# Patient Record
Sex: Male | Born: 1985 | Race: Black or African American | Hispanic: No | Marital: Married | Smoking: Never smoker
Health system: Southern US, Community
[De-identification: ages and names within clinical notes are randomized; demographics above are authoritative.]

## PROBLEM LIST (undated history)

## (undated) DIAGNOSIS — J020 Streptococcal pharyngitis: Secondary | ICD-10-CM

---
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
## 2006-01-08 ENCOUNTER — Emergency Department (HOSPITAL_COMMUNITY): Admission: EM | Admit: 2006-01-08 | Discharge: 2006-01-08 | Payer: Self-pay | Admitting: Family Medicine

## 2007-03-01 ENCOUNTER — Emergency Department (HOSPITAL_COMMUNITY): Admission: EM | Admit: 2007-03-01 | Discharge: 2007-03-01 | Payer: Self-pay | Admitting: Family Medicine

## 2007-03-07 ENCOUNTER — Emergency Department (HOSPITAL_COMMUNITY): Admission: EM | Admit: 2007-03-07 | Discharge: 2007-03-07 | Payer: Self-pay | Admitting: Family Medicine

## 2007-05-02 ENCOUNTER — Emergency Department (HOSPITAL_COMMUNITY): Admission: EM | Admit: 2007-05-02 | Discharge: 2007-05-02 | Payer: Self-pay | Admitting: Emergency Medicine

## 2008-03-19 ENCOUNTER — Emergency Department (HOSPITAL_COMMUNITY): Admission: EM | Admit: 2008-03-19 | Discharge: 2008-03-19 | Payer: Self-pay | Admitting: Family Medicine

## 2008-06-23 ENCOUNTER — Emergency Department (HOSPITAL_COMMUNITY): Admission: EM | Admit: 2008-06-23 | Discharge: 2008-06-23 | Payer: Self-pay | Admitting: Emergency Medicine

## 2010-03-10 ENCOUNTER — Other Ambulatory Visit: Payer: Self-pay | Admitting: Internal Medicine

## 2010-03-10 ENCOUNTER — Ambulatory Visit
Admission: RE | Admit: 2010-03-10 | Discharge: 2010-03-10 | Disposition: A | Discharge status: Home / Self Care | Payer: Self-pay | Source: Ambulatory Visit | Attending: Internal Medicine | Admitting: Internal Medicine

## 2010-03-10 DIAGNOSIS — M545 Low back pain: Secondary | ICD-10-CM

## 2011-07-26 DIAGNOSIS — B9789 Other viral agents as the cause of diseases classified elsewhere: Secondary | ICD-10-CM | POA: Insufficient documentation

## 2011-07-27 ENCOUNTER — Other Ambulatory Visit (HOSPITAL_BASED_OUTPATIENT_CLINIC_OR_DEPARTMENT_OTHER): Payer: Self-pay | Admitting: Emergency Medicine

## 2011-07-27 ENCOUNTER — Ambulatory Visit (HOSPITAL_BASED_OUTPATIENT_CLINIC_OR_DEPARTMENT_OTHER)
Admission: RE | Admit: 2011-07-27 | Discharge: 2011-07-27 | Disposition: A | Discharge status: Home / Self Care | Payer: BC Managed Care – PPO | Source: Ambulatory Visit | Attending: Emergency Medicine | Admitting: Emergency Medicine

## 2011-07-27 ENCOUNTER — Emergency Department (HOSPITAL_BASED_OUTPATIENT_CLINIC_OR_DEPARTMENT_OTHER)
Admission: EM | Admit: 2011-07-27 | Discharge: 2011-07-27 | Disposition: A | Discharge status: Home / Self Care | Payer: BC Managed Care – PPO | Attending: Emergency Medicine | Admitting: Emergency Medicine

## 2011-07-27 DIAGNOSIS — R509 Fever, unspecified: Secondary | ICD-10-CM

## 2011-07-27 LAB — DIFFERENTIAL
Band Neutrophils: 0 % (ref 0–10)
Basophils Absolute: 0 10*3/uL (ref 0.0–0.1)
Basophils Relative: 0 % (ref 0–1)
Blasts: 0 %
Eosinophils Absolute: 0.3 10*3/uL (ref 0.0–0.7)
Eosinophils Relative: 2 % (ref 0–5)
Lymphocytes Relative: 15 % (ref 12–46)
Lymphs Abs: 2.1 10*3/uL (ref 0.7–4.0)
Metamyelocytes Relative: 0 %
Monocytes Absolute: 2 10*3/uL — ABNORMAL HIGH (ref 0.1–1.0)
Monocytes Relative: 14 % — ABNORMAL HIGH (ref 3–12)
Myelocytes: 0 %
Neutro Abs: 9.6 10*3/uL — ABNORMAL HIGH (ref 1.7–7.7)
Promyelocytes Absolute: 0 %
nRBC: 0 /100 WBC

## 2011-07-27 LAB — URINE MICROSCOPIC-ADD ON

## 2011-07-27 LAB — URINALYSIS, ROUTINE W REFLEX MICROSCOPIC
Hgb urine dipstick: NEGATIVE
Nitrite: NEGATIVE
Specific Gravity, Urine: 1.027 (ref 1.005–1.030)
Urobilinogen, UA: >8 mg/dL — ABNORMAL HIGH (ref 0.0–1.0)
pH: 6 (ref 5.0–8.0)

## 2011-07-27 LAB — BASIC METABOLIC PANEL
BUN: 11 mg/dL (ref 6–23)
CO2: 25 mEq/L (ref 19–32)
Calcium: 9 mg/dL (ref 8.4–10.5)
Chloride: 99 mEq/L (ref 96–112)
Creatinine, Ser: 1 mg/dL (ref 0.50–1.35)
GFR calc Af Amer: >90 mL/min (ref >90)
GFR calc non Af Amer: >90 mL/min (ref >90)
Glucose, Bld: 94 mg/dL (ref 70–99)
Potassium: 3.4 mEq/L — ABNORMAL LOW (ref 3.5–5.1)
Sodium: 137 mEq/L (ref 135–145)

## 2011-07-27 LAB — CBC
HCT: 31.5 % — ABNORMAL LOW (ref 39.0–52.0)
Hemoglobin: 10.5 g/dL — ABNORMAL LOW (ref 13.0–17.0)
MCH: 28.1 pg (ref 26.0–34.0)
MCHC: 33.3 g/dL (ref 30.0–36.0)
MCV: 84.2 fL (ref 78.0–100.0)
Platelets: 234 10*3/uL (ref 150–400)
WBC: 14 10*3/uL — ABNORMAL HIGH (ref 4.0–10.5)

## 2011-07-29 ENCOUNTER — Emergency Department (HOSPITAL_BASED_OUTPATIENT_CLINIC_OR_DEPARTMENT_OTHER): Payer: BC Managed Care – PPO

## 2011-07-29 ENCOUNTER — Encounter (HOSPITAL_BASED_OUTPATIENT_CLINIC_OR_DEPARTMENT_OTHER): Payer: Self-pay

## 2011-07-29 ENCOUNTER — Emergency Department (HOSPITAL_BASED_OUTPATIENT_CLINIC_OR_DEPARTMENT_OTHER)
Admission: EM | Admit: 2011-07-29 | Discharge: 2011-07-29 | Disposition: A | Discharge status: Home / Self Care | Payer: BC Managed Care – PPO | Attending: Emergency Medicine | Admitting: Emergency Medicine

## 2011-07-29 DIAGNOSIS — N390 Urinary tract infection, site not specified: Secondary | ICD-10-CM

## 2011-07-29 DIAGNOSIS — R509 Fever, unspecified: Secondary | ICD-10-CM

## 2011-07-29 DIAGNOSIS — R51 Headache: Secondary | ICD-10-CM | POA: Insufficient documentation

## 2011-07-29 DIAGNOSIS — M25519 Pain in unspecified shoulder: Secondary | ICD-10-CM | POA: Insufficient documentation

## 2011-07-29 DIAGNOSIS — M542 Cervicalgia: Secondary | ICD-10-CM | POA: Insufficient documentation

## 2011-07-29 LAB — COMPREHENSIVE METABOLIC PANEL
ALT: 20 U/L (ref 0–53)
AST: 21 U/L (ref 0–37)
Albumin: 3.4 g/dL — ABNORMAL LOW (ref 3.5–5.2)
Alkaline Phosphatase: 71 U/L (ref 39–117)
BUN: 11 mg/dL (ref 6–23)
CO2: 24 mEq/L (ref 19–32)
Calcium: 9 mg/dL (ref 8.4–10.5)
Chloride: 100 mEq/L (ref 96–112)
Creatinine, Ser: 0.9 mg/dL (ref 0.50–1.35)
GFR calc Af Amer: >90 mL/min (ref >90)
GFR calc non Af Amer: >90 mL/min (ref >90)
Glucose, Bld: 162 mg/dL — ABNORMAL HIGH (ref 70–99)
Potassium: 2.9 mEq/L — ABNORMAL LOW (ref 3.5–5.1)
Sodium: 137 mEq/L (ref 135–145)
Total Bilirubin: 0.9 mg/dL (ref 0.3–1.2)

## 2011-07-29 LAB — URINALYSIS, ROUTINE W REFLEX MICROSCOPIC
Bilirubin Urine: NEGATIVE
Glucose, UA: NEGATIVE mg/dL
Hgb urine dipstick: NEGATIVE
Ketones, ur: 15 mg/dL — AB
Leukocytes, UA: NEGATIVE
Protein, ur: 30 mg/dL — AB
Specific Gravity, Urine: 1.026 (ref 1.005–1.030)
Urobilinogen, UA: >8 mg/dL — ABNORMAL HIGH (ref 0.0–1.0)

## 2011-07-29 LAB — URINE MICROSCOPIC-ADD ON

## 2011-07-29 LAB — CBC
HCT: 29.2 % — ABNORMAL LOW (ref 39.0–52.0)
MCH: 28.2 pg (ref 26.0–34.0)
MCHC: 33.9 g/dL (ref 30.0–36.0)
MCV: 83.2 fL (ref 78.0–100.0)
RDW: 13.2 % (ref 11.5–15.5)
WBC: 15.4 10*3/uL — ABNORMAL HIGH (ref 4.0–10.5)

## 2011-07-29 MED ORDER — SODIUM CHLORIDE 0.9 % IV SOLN
1000.0000 mL | INTRAVENOUS | Status: DC
  Administered 2011-07-29: 1000 mL via INTRAVENOUS

## 2011-07-29 MED ORDER — POTASSIUM CHLORIDE CRYS ER 20 MEQ PO TBCR
40.0000 meq | EXTENDED_RELEASE_TABLET | Freq: Once | ORAL | Status: AC
  Administered 2011-07-29: 40 meq via ORAL
  Filled 2011-07-29: qty 2

## 2011-07-29 MED ORDER — ONDANSETRON HCL 4 MG/2ML IJ SOLN
4.0000 mg | Freq: Once | INTRAMUSCULAR | Status: AC
  Administered 2011-07-29: 4 mg via INTRAVENOUS

## 2011-07-29 MED ORDER — DEXTROSE 5 % IV SOLN
1.0000 g | Freq: Once | INTRAVENOUS | Status: AC
  Administered 2011-07-29: 1 g via INTRAVENOUS
  Filled 2011-07-29: qty 10

## 2011-07-29 MED ORDER — CEPHALEXIN 500 MG PO CAPS: 500.0000 mg | ORAL_CAPSULE | Freq: Four times a day (QID) | ORAL | Status: AC

## 2011-07-29 MED ORDER — SODIUM CHLORIDE 0.9 % IV SOLN
1000.0000 mL | Freq: Once | INTRAVENOUS | Status: AC
  Administered 2011-07-29: 1000 mL via INTRAVENOUS

## 2011-07-29 MED ORDER — ONDANSETRON HCL 4 MG/2ML IJ SOLN
INTRAMUSCULAR | Status: AC
  Filled 2011-07-29: qty 2

## 2011-07-29 NOTE — ED Notes (Signed)
Pt states he is still unable to void  

## 2011-07-29 NOTE — ED Provider Notes (Signed)
History     CSN: [id number]  Arrival date & time 07/29/11  3244   First MD Initiated Contact with Patient 07/29/11 0820      Chief Complaint  Patient presents with  . Fever  . Neck Pain  . Shoulder Pain  . Headache    (Consider location/radiation/quality/duration/timing/severity/associated sxs/prior treatment) HPI  Patient complaining of body aches and fever for 4 days. Patient seen here on Sunday and had blood work and chest x-Tavion Senkbeil. Chest x-Johnpatrick Jenny did not show any infiltrates. Patient states he has had some productive cough. He has had some headache although not severe. He denies any abdominal pain nausea or vomiting. He denies any urinary tract infection symptoms. He denies any other significant exposures to anyone that was sick. He has been generally weak but denies being lightheaded or having syncope.  History reviewed. No pertinent past medical history.  History reviewed. No pertinent past surgical history.  No family history on file.  History  Substance Use Topics  . Smoking status: Never Smoker   . Smokeless tobacco: Never Used  . Alcohol Use: Yes     occasional      Review of Systems  All other systems reviewed and are negative.    Allergies  Tramadol  Home Medications   Current Outpatient Rx  Name Route Sig Dispense Refill  . ACETAMINOPHEN 325 MG PO TABS Oral Take 650 mg by mouth every 6 (six) hours as needed.      BP 111/61  Pulse 101  Temp 99 F (37.2 C) (Oral)  Resp 18  Ht 5\' 8"  (1.727 m)  Wt 215 lb (97.523 kg)  BMI 32.69 kg/m2  SpO2 98%  Physical Exam  Nursing note and vitals reviewed. Constitutional: He is oriented to person, place, and time. He appears well-developed and well-nourished.  HENT:  Head: Normocephalic and atraumatic.  Right Ear: External ear normal.  Left Ear: External ear normal.  Nose: Nose normal.  Mouth/Throat: Oropharynx is clear and moist.  Eyes: Conjunctivae and EOM are normal. Pupils are equal, round, and reactive  to light.  Neck: Normal range of motion. Neck supple.  Cardiovascular: Normal rate, regular rhythm, normal heart sounds and intact distal pulses.   Pulmonary/Chest: Effort normal and breath sounds normal.  Abdominal: Soft. Bowel sounds are normal.  Musculoskeletal: Normal range of motion.  Neurological: He is alert and oriented to person, place, and time. He has normal reflexes.  Skin: Skin is warm and dry.  Psychiatric: He has a normal mood and affect. His behavior is normal. Thought content normal.    ED Course  Procedures (including critical care time)  Labs Reviewed  COMPREHENSIVE METABOLIC PANEL - Abnormal; Notable for the following:    Potassium 2.9 (*)     Glucose, Bld 162 (*)     Albumin 3.4 (*)     All other components within normal limits  CBC - Abnormal; Notable for the following:    WBC 15.4 (*)     RBC 3.51 (*)     Hemoglobin 9.9 (*)     HCT 29.2 (*)     All other components within normal limits  URINALYSIS, ROUTINE W REFLEX MICROSCOPIC - Abnormal; Notable for the following:    Color, Urine ORANGE (*)  BIOCHEMICALS MAY BE AFFECTED BY COLOR   Ketones, ur 15 (*)     Protein, ur 30 (*)     Urobilinogen, UA >8.0 (*)     All other components within normal limits  URINE MICROSCOPIC-ADD ON -  Abnormal; Notable for the following:    Bacteria, UA FEW (*)     All other components within normal limits   Dg Chest 2 View  07/29/2011  *RADIOLOGY REPORT*  Clinical Data:  Fever, headache, neck pain, shoulder pain, cough  CHEST - 2 VIEW  Comparison: 07/26/2011  Findings: Normal heart size, mediastinal contours, and pulmonary vascularity. Lungs clear. No pleural effusion or pneumothorax. Bones unremarkable.  IMPRESSION: Normal exam.  Original Report Authenticated By: Lollie Marrow, M.D.     No diagnosis found.    MDM   Patient with continued elevated white count of 15,400 and continued anemia with hemoglobin of 9.9. This is not showing any significant change from previous. He  has 15 ketones in his urine and is given 2 L of normal saline here. He is able to take by mouth is not having any vomiting or diarrhea. Blood sugars elevated at 162. He has few bacteria in his urine. Urine is to be cultured and he is given Rocephin here and placed on Keflex.      Hilario Quarry, MD 07/29/11 1235

## 2011-07-29 NOTE — ED Notes (Signed)
Pt reports headache, neck pain, shoulder pain and fever of 103 last pm.  Seen in ED on Sunday but states he's not feeling any better.

## 2011-10-07 ENCOUNTER — Other Ambulatory Visit: Payer: Self-pay | Admitting: Podiatry

## 2011-10-07 DIAGNOSIS — R52 Pain, unspecified: Secondary | ICD-10-CM

## 2011-10-10 ENCOUNTER — Ambulatory Visit (HOSPITAL_BASED_OUTPATIENT_CLINIC_OR_DEPARTMENT_OTHER)
Admission: RE | Admit: 2011-10-10 | Discharge: 2011-10-10 | Disposition: A | Discharge status: Home / Self Care | Payer: BC Managed Care – PPO | Source: Ambulatory Visit | Attending: Podiatry | Admitting: Podiatry

## 2011-10-10 DIAGNOSIS — M25579 Pain in unspecified ankle and joints of unspecified foot: Secondary | ICD-10-CM | POA: Insufficient documentation

## 2011-10-10 DIAGNOSIS — M7989 Other specified soft tissue disorders: Secondary | ICD-10-CM | POA: Insufficient documentation

## 2011-10-10 DIAGNOSIS — R52 Pain, unspecified: Secondary | ICD-10-CM

## 2011-10-10 MED ORDER — GADOBENATE DIMEGLUMINE 529 MG/ML IV SOLN
20.0000 mL | Freq: Once | INTRAVENOUS | Status: AC | PRN
  Administered 2011-10-10: 20 mL via INTRAVENOUS

## 2012-03-15 ENCOUNTER — Encounter: Payer: Self-pay | Admitting: Podiatry

## 2012-07-05 ENCOUNTER — Encounter (HOSPITAL_BASED_OUTPATIENT_CLINIC_OR_DEPARTMENT_OTHER): Payer: Self-pay | Admitting: *Deleted

## 2012-07-05 ENCOUNTER — Emergency Department (HOSPITAL_BASED_OUTPATIENT_CLINIC_OR_DEPARTMENT_OTHER)
Admission: EM | Admit: 2012-07-05 | Discharge: 2012-07-05 | Disposition: A | Discharge status: Home / Self Care | Payer: BC Managed Care – PPO | Attending: Emergency Medicine | Admitting: Emergency Medicine

## 2012-07-05 DIAGNOSIS — M545 Low back pain, unspecified: Secondary | ICD-10-CM

## 2012-07-05 MED ORDER — NAPROXEN 500 MG PO TABS: 500.0000 mg | ORAL_TABLET | Freq: Two times a day (BID) | ORAL | Status: DC

## 2012-07-05 MED ORDER — OXYCODONE-ACETAMINOPHEN 5-325 MG PO TABS
1.0000 | ORAL_TABLET | Freq: Once | ORAL | Status: AC
  Administered 2012-07-05: 1 via ORAL
  Filled 2012-07-05 (×2): qty 1

## 2012-07-05 MED ORDER — IBUPROFEN 800 MG PO TABS
800.0000 mg | ORAL_TABLET | Freq: Once | ORAL | Status: AC
  Administered 2012-07-05: 800 mg via ORAL
  Filled 2012-07-05: qty 1

## 2012-07-05 MED ORDER — OXYCODONE-ACETAMINOPHEN 5-325 MG PO TABS: 1.0000 | ORAL_TABLET | ORAL | Status: DC | PRN

## 2012-07-05 MED ORDER — CYCLOBENZAPRINE HCL 10 MG PO TABS
5.0000 mg | ORAL_TABLET | Freq: Once | ORAL | Status: AC
  Administered 2012-07-05: 5 mg via ORAL
  Filled 2012-07-05: qty 1

## 2012-07-05 MED ORDER — ORPHENADRINE CITRATE ER 100 MG PO TB12: 100.0000 mg | ORAL_TABLET | Freq: Two times a day (BID) | ORAL | Status: DC

## 2012-07-05 NOTE — ED Provider Notes (Signed)
History  This chart was scribed for Dione Booze, MD by Ardelia Mems, ED Scribe. This patient was seen in room MH12/MH12 and the patient's care was started at 12:20 AM.  CSN: [id number]  Arrival date & time 07/05/12  0004   Chief Complaint  Patient presents with  . Back Pain    The history is provided by the patient. No language interpreter was used.   HPI Comments: Calvin Kirby is a 26 y.o. male who presents to the Emergency Department complaining of constant, moderate, "7/10", non-radiating lower middle back pain of 4 months duration. In describing his pain, pt states that "it feels like my muscles are being squeezed". Pt states that his pain made worse with standing and with moving his legs in certain ways. Pt states that nothing makes his pain better, including Ibuprofen, which he has taken without relief. Pt denies back pain prior to 4 months ago. Pt states that he did not drive himself here. Pt states that he is otherwise in good health. Pt denies leg pain, neck pain, abdominal pain, weakness, numbness, saddle anaesthesia, bowel or bladder incontinence or any other symptoms.  PCP- Dr. Greggory Stallion Osei-Bonsu   History reviewed. No pertinent past medical history.  History reviewed. No pertinent past surgical history.  History reviewed. No pertinent family history.  History  Substance Use Topics  . Smoking status: Never Smoker   . Smokeless tobacco: Never Used  . Alcohol Use: Yes     Comment: occasional    Review of Systems  Constitutional: Negative for fever and chills.  HENT: Negative for congestion, sore throat, rhinorrhea and neck pain.   Eyes: Negative for visual disturbance.  Respiratory: Negative for cough and shortness of breath.   Cardiovascular: Negative for chest pain.  Gastrointestinal: Negative for nausea, vomiting, abdominal pain and diarrhea.       Denies bowel incontinence.  Genitourinary: Negative for dysuria.       Denies bladder incontinence.   Musculoskeletal: Positive for back pain (lower, middle).  Skin: Negative for rash.  Neurological: Negative for weakness and numbness.  Psychiatric/Behavioral: Negative for confusion.  A complete 10 system review of systems was obtained and all systems are negative except as noted in the HPI and PMH.   Allergies  Tramadol  Home Medications   Current Outpatient Rx  Name  Route  Sig  Dispense  Refill  . acetaminophen (TYLENOL) 325 MG tablet   Oral   Take 650 mg by mouth every 6 (six) hours as needed.          Triage Vitals: P 127/65  Pulse 73  Temp(Src) 97.6 F (36.4 C) (Oral)  Resp 16  Ht 5\' 8"  (1.727 m)  Wt 205 lb (92.987 kg)  BMI 31.18 kg/m2  SpO2 100%  Physical Exam  Nursing note and vitals reviewed. Constitutional: He is oriented to person, place, and time. He appears well-developed and well-nourished.  Lying in bed in right lateral decubitus position, with hips and knees flexed.   HENT:  Head: Normocephalic and atraumatic.  Eyes: EOM are normal. Pupils are equal, round, and reactive to light.  Neck: Normal range of motion. Neck supple. No tracheal deviation present.  Cardiovascular: Normal rate and regular rhythm.   Pulmonary/Chest: Effort normal. No respiratory distress.  Abdominal: Soft. There is no tenderness.  Musculoskeletal:  Mild tenderness of the L-spine. Bilateral paralumbar spasms, worse on the right. Negative straight leg raise.  Neurological: He is alert and oriented to person, place, and time.  Skin:  Skin is warm and dry. No rash noted.  Psychiatric: He has a normal mood and affect. His behavior is normal.    ED Course  Procedures (including critical care time)  DIAGNOSTIC STUDIES: Oxygen Saturation is 100% on RA, normal by my interpretation.    COORDINATION OF CARE: 12:25 AM- Pt advised of option to treat back pain with anti-inflammatory drugs, muscle relaxants and narcotics and pt agrees. Pt advised to follow-up with PCP, and to keep ice on  his back as needed and pt agrees.  Medications  ibuprofen (ADVIL,MOTRIN) tablet 800 mg (800 mg Oral Given 07/05/12 0034)  cyclobenzaprine (FLEXERIL) tablet 5 mg (5 mg Oral Given 07/05/12 0034)  oxyCODONE-acetaminophen (PERCOCET/ROXICET) 5-325 MG per tablet 1 tablet (1 tablet Oral Given 07/05/12 0034)     1. Low back pain     MDM  Low back pain without evidence of neurologic injury. No indication for imaging of lumbar spine at this time. He is discharged with prescriptions for naproxen, orphenadrine, and oxycodone-acetaminophen. He is to followup with his PCP. Given his long duration of pain, if he fails to respond to conservative treatments, he should be considered for MRI scanning and consideration for neurosurgical referral.    I personally performed the services described in this documentation, which was scribed in my presence. The recorded information has been reviewed and is accurate.     Dione Booze, MD 07/05/12 [phone]

## 2012-07-05 NOTE — ED Notes (Signed)
Pt c/o lower back pain x 2 hrs

## 2012-08-08 ENCOUNTER — Ambulatory Visit: Payer: BC Managed Care – PPO | Attending: Internal Medicine | Admitting: Physical Therapy

## 2012-08-08 DIAGNOSIS — M545 Low back pain, unspecified: Secondary | ICD-10-CM | POA: Insufficient documentation

## 2012-08-08 DIAGNOSIS — IMO0001 Reserved for inherently not codable concepts without codable children: Secondary | ICD-10-CM | POA: Insufficient documentation

## 2012-08-08 DIAGNOSIS — M6281 Muscle weakness (generalized): Secondary | ICD-10-CM | POA: Insufficient documentation

## 2012-08-26 ENCOUNTER — Ambulatory Visit: Payer: BC Managed Care – PPO | Admitting: Physical Therapy

## 2012-10-26 ENCOUNTER — Emergency Department (HOSPITAL_BASED_OUTPATIENT_CLINIC_OR_DEPARTMENT_OTHER): Payer: BC Managed Care – PPO

## 2012-10-26 ENCOUNTER — Encounter (HOSPITAL_BASED_OUTPATIENT_CLINIC_OR_DEPARTMENT_OTHER): Payer: Self-pay | Admitting: Emergency Medicine

## 2012-10-26 ENCOUNTER — Emergency Department (HOSPITAL_BASED_OUTPATIENT_CLINIC_OR_DEPARTMENT_OTHER)
Admission: EM | Admit: 2012-10-26 | Discharge: 2012-10-26 | Disposition: A | Discharge status: Home / Self Care | Payer: BC Managed Care – PPO | Attending: Emergency Medicine | Admitting: Emergency Medicine

## 2012-10-26 DIAGNOSIS — Z79899 Other long term (current) drug therapy: Secondary | ICD-10-CM | POA: Insufficient documentation

## 2012-10-26 DIAGNOSIS — R091 Pleurisy: Secondary | ICD-10-CM | POA: Insufficient documentation

## 2012-10-26 DIAGNOSIS — R071 Chest pain on breathing: Secondary | ICD-10-CM | POA: Insufficient documentation

## 2012-10-26 DIAGNOSIS — R079 Chest pain, unspecified: Secondary | ICD-10-CM

## 2012-10-26 MED ORDER — NAPROXEN 500 MG PO TABS: 500.0000 mg | ORAL_TABLET | Freq: Two times a day (BID) | ORAL | Status: DC

## 2012-10-26 MED ORDER — HYDROCODONE-ACETAMINOPHEN 5-325 MG PO TABS: 1.0000 | ORAL_TABLET | ORAL | Status: DC | PRN

## 2012-10-26 NOTE — ED Notes (Signed)
C/o right side CP/shoulder pain x 1 week-states pain is worse with movement and when he sneezes-denies injury

## 2012-10-26 NOTE — ED Provider Notes (Signed)
CSN: [id number]     Arrival date & time 10/26/12  1050 History   First MD Initiated Contact with Patient 10/26/12 1109     Chief Complaint  Patient presents with  . Chest Pain    HPI  Police had several days of well localized sharp right-sided chest pain. Uterus was small breath but he states if he laughs or sneezes it "comes me to my knees". Holding still but is minimally painful. 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 Does not have a regular exertional chest pain syndrome. Does not do formal exercise or sporting activities or gallops. No unusual for him excessive lifting. No falls no injuries no trauma. No cough no fever no shortness of breath. He does not smoke. No personal history of prolonged immobilization cast splints fractures surgeries or DVT risks. No family history DVT or PE.  History reviewed. No pertinent past medical history. History reviewed. No pertinent past surgical history. No family history on file. History  Substance Use Topics  . Smoking status: Never Smoker   . Smokeless tobacco: Never Used  . Alcohol Use: Yes     Comment: occasional    Review of Systems  Constitutional: Negative for fever, chills, diaphoresis, appetite change and fatigue.  HENT: Negative for mouth sores, sore throat and trouble swallowing.   Eyes: Negative for visual disturbance.  Respiratory: Negative for cough, chest tightness, shortness of breath and wheezing.        Pleuritic pain  Cardiovascular: Positive for chest pain.  Gastrointestinal: Negative for nausea, vomiting, abdominal pain, diarrhea and abdominal distention.  Endocrine: Negative for polydipsia, polyphagia and polyuria.  Genitourinary: Negative for dysuria, frequency and hematuria.  Musculoskeletal: Negative for gait problem.  Skin: Negative for color change, pallor and rash.  Neurological: Negative for dizziness, syncope, light-headedness and headaches.  Hematological: Does not bruise/bleed easily.    Psychiatric/Behavioral: Negative for behavioral problems and confusion.    Allergies  Tramadol  Home Medications   Current Outpatient Rx  Name  Route  Sig  Dispense  Refill  . acetaminophen (TYLENOL) 325 MG tablet   Oral   Take 650 mg by mouth every 6 (six) hours as needed.         Marland Kitchen HYDROcodone-acetaminophen (NORCO/VICODIN) 5-325 MG per tablet   Oral   Take 1 tablet by mouth every 4 (four) hours as needed for pain.   10 tablet   0   . naproxen (NAPROSYN) 500 MG tablet   Oral   Take 1 tablet (500 mg total) by mouth 2 (two) times daily.   30 tablet   0   . naproxen (NAPROSYN) 500 MG tablet   Oral   Take 1 tablet (500 mg total) by mouth 2 (two) times daily.   30 tablet   0   . orphenadrine (NORFLEX) 100 MG tablet   Oral   Take 1 tablet (100 mg total) by mouth 2 (two) times daily.   30 tablet   0   . oxyCODONE-acetaminophen (ROXICET) 5-325 MG per tablet   Oral   Take 1 tablet by mouth every 4 (four) hours as needed for pain.   20 tablet   0    BP 145/60  Pulse 77  Temp(Src) 97.8 F (36.6 C) (Oral)  Resp 16  Ht 5\' 8"  (1.727 m)  Wt 225 lb (102.059 kg)  BMI 34.22 kg/m2  SpO2 100% Physical Exam  Constitutional: He is oriented to person, place, and time. He appears well-developed and well-nourished. No  distress.  HENT:  Head: Normocephalic.  Eyes: Conjunctivae are normal. Pupils are equal, round, and reactive to light. No scleral icterus.  Neck: Normal range of motion. Neck supple. No thyromegaly present.  Cardiovascular: Normal rate and regular rhythm.  Exam reveals no gallop and no friction rub.   No murmur heard. Pulmonary/Chest: Effort normal and breath sounds normal. No respiratory distress. He has no wheezes. He has no rales.    Normal breath sounds. No asymmetry. No crackles or rales. No subcutaneous air in the neck or chest. Nontender over the skin of the chest wall. No erythema rash or vesicles.  Abdominal: Soft. Bowel sounds are normal. He  exhibits no distension. There is no tenderness. There is no rebound.  Musculoskeletal: Normal range of motion.  Neurological: He is alert and oriented to person, place, and time.  Skin: Skin is warm and dry. No rash noted.  Psychiatric: He has a normal mood and affect. His behavior is normal.    ED Course  Procedures (including critical care time) Labs Review Labs Reviewed - No data to display Imaging Review Dg Chest 2 View  10/26/2012   CLINICAL DATA:  Chest pain for 1 week.  EXAM: CHEST  2 VIEW  COMPARISON:  PA and lateral chest 07/29/2011.  FINDINGS: Heart size and mediastinal contours are within normal limits. Both lungs are clear. Visualized skeletal structures are unremarkable.  IMPRESSION: Negative exam.   Electronically Signed   By: Drusilla Kanner M.D.   On: 10/26/2012 11:53    EKG Interpretation   None       MDM   1. Pleurisy   2. Chest pain    Localized sharp right-sided chest pain. A normal exam. A normal x-ray without pneumothorax or infiltrate. He is not tachycardic. He is not hypoxemic. He has had no fever or infectious symptoms. Clinical diagnosis at this time is pleurisy. Plan anti-inflammatories and pain medications and close followup with rash fever left-sided pain shortness of breath or other changing or new symptoms.   Roney Marion, MD 10/26/12 [phone]

## 2013-06-23 ENCOUNTER — Encounter (HOSPITAL_BASED_OUTPATIENT_CLINIC_OR_DEPARTMENT_OTHER): Payer: Self-pay | Admitting: Emergency Medicine

## 2013-06-23 ENCOUNTER — Emergency Department (HOSPITAL_BASED_OUTPATIENT_CLINIC_OR_DEPARTMENT_OTHER)
Admission: EM | Admit: 2013-06-23 | Discharge: 2013-06-23 | Disposition: A | Discharge status: Home / Self Care | Payer: BC Managed Care – PPO | Attending: Emergency Medicine | Admitting: Emergency Medicine

## 2013-06-23 DIAGNOSIS — J029 Acute pharyngitis, unspecified: Secondary | ICD-10-CM

## 2013-06-23 DIAGNOSIS — Z8619 Personal history of other infectious and parasitic diseases: Secondary | ICD-10-CM | POA: Insufficient documentation

## 2013-06-23 DIAGNOSIS — Z791 Long term (current) use of non-steroidal anti-inflammatories (NSAID): Secondary | ICD-10-CM | POA: Insufficient documentation

## 2013-06-23 DIAGNOSIS — Z79899 Other long term (current) drug therapy: Secondary | ICD-10-CM | POA: Insufficient documentation

## 2013-06-23 HISTORY — DX: Streptococcal pharyngitis: J02.0

## 2013-06-23 LAB — RAPID STREP SCREEN (MED CTR MEBANE ONLY): Streptococcus, Group A Screen (Direct): NEGATIVE

## 2013-06-23 MED ORDER — PENICILLIN V POTASSIUM 250 MG PO TABS: 250.0000 mg | ORAL_TABLET | Freq: Four times a day (QID) | ORAL | Status: AC

## 2013-06-23 NOTE — ED Provider Notes (Signed)
CSN: [id number]     Arrival date & time 06/23/13  1533 History   First MD Initiated Contact with Patient 06/23/13 1548     Chief Complaint  Patient presents with  . Sore Throat      HPI Patient's had a five-day history of sore throat with some low-grade fever.  History of frequent strep infections.  No nausea vomiting or diarrhea.  No other complaints. Past Medical History  Diagnosis Date  . Strep throat    History reviewed. No pertinent past surgical history. No family history on file. History  Substance Use Topics  . Smoking status: Never Smoker   . Smokeless tobacco: Never Used  . Alcohol Use: Yes     Comment: occasional    Review of Systems  All other systems reviewed and are negative  Allergies  Tramadol  Home Medications   Prior to Admission medications   Medication Sig Start Date End Date Taking? Authorizing Provider  acetaminophen (TYLENOL) 325 MG tablet Take 650 mg by mouth every 6 (six) hours as needed.    Historical Provider, MD  HYDROcodone-acetaminophen (NORCO/VICODIN) 5-325 MG per tablet Take 1 tablet by mouth every 4 (four) hours as needed for pain. 10/26/12   Rolland PorterMark James, MD  naproxen (NAPROSYN) 500 MG tablet Take 1 tablet (500 mg total) by mouth 2 (two) times daily. 07/05/12   Dione Boozeavid Glick, MD  naproxen (NAPROSYN) 500 MG tablet Take 1 tablet (500 mg total) by mouth 2 (two) times daily. 10/26/12   Rolland PorterMark James, MD  orphenadrine (NORFLEX) 100 MG tablet Take 1 tablet (100 mg total) by mouth 2 (two) times daily. 07/05/12   Dione Boozeavid Glick, MD  oxyCODONE-acetaminophen (ROXICET) 5-325 MG per tablet Take 1 tablet by mouth every 4 (four) hours as needed for pain. 07/05/12   Dione Boozeavid Glick, MD  penicillin v potassium (VEETID) 250 MG tablet Take 1 tablet (250 mg total) by mouth 4 (four) times daily. 06/23/13 06/30/13  Nelia Shiobert L Janeal Abadi, MD   BP 132/66  Pulse 80  Temp(Src) 97.6 F (36.4 C) (Oral)  Resp 16  Ht 5\' 8"  (1.727 m)  Wt 215 lb (97.523 kg)  BMI 32.70 kg/m2  SpO2  97% Physical Exam  Nursing note and vitals reviewed. Constitutional: He is oriented to person, place, and time. He appears well-developed and well-nourished. No distress.  HENT:  Head: Normocephalic and atraumatic.  Mouth/Throat: Oropharyngeal exudate present.  Eyes: Pupils are equal, round, and reactive to light.  Neck: Normal range of motion. Neck supple. No tracheal deviation present. No thyromegaly present.  Cardiovascular: Normal rate and intact distal pulses.   Pulmonary/Chest: No stridor. No respiratory distress.  Abdominal: Normal appearance. He exhibits no distension.  Musculoskeletal: Normal range of motion.  Lymphadenopathy:    He has cervical adenopathy.  Neurological: He is alert and oriented to person, place, and time. No cranial nerve deficit.  Skin: Skin is warm and dry. No rash noted.  Psychiatric: He has a normal mood and affect. His behavior is normal.    ED Course  Procedures (including critical care time) Labs Review Labs Reviewed  RAPID STREP SCREEN  CULTURE, GROUP A STREP    In light of numerous strep infections plan on treatment with antibiotics until former cultures are returned.     Imaging Review No results found.    MDM   Final diagnoses:  Acute pharyngitis, unspecified pharyngitis type        Nelia Shiobert L Khayla Koppenhaver, MD 06/23/13 [phone]

## 2013-06-23 NOTE — ED Notes (Signed)
Sore throat x1 week.  Hx of repeated strep infections.

## 2013-06-23 NOTE — Discharge Instructions (Signed)
Pharyngitis Pharyngitis is a sore throat (pharynx). There is redness, pain, and swelling of your throat. HOME CARE   Drink enough fluids to keep your pee (urine) clear or pale yellow.  Only take medicine as told by your doctor.  You may get sick again if you do not take medicine as told. Finish your medicines, even if you start to feel better.  Do not take aspirin.  Rest.  Rinse your mouth (gargle) with salt water ( tsp of salt per 1 qt of water) every 1-2 hours. This will help the pain.  If you are not at risk for choking, you can suck on hard candy or sore throat lozenges. GET HELP IF:  You have large, tender lumps on your neck.  You have a rash.  You cough up green, yellow-brown, or bloody spit. GET HELP RIGHT AWAY IF:   You have a stiff neck.  You drool or cannot swallow liquids.  You throw up (vomit) or are not able to keep medicine or liquids down.  You have very bad pain that does not go away with medicine.  You have problems breathing (not from a stuffy nose). MAKE SURE YOU:   Understand these instructions.  Will watch your condition.  Will get help right away if you are not doing well or get worse. Document Released: 06/10/2007 Document Revised: 10/12/2012 Document Reviewed: 08/29/2012 [facility] Patient Information 2015 [place], [place]. This information is not intended to replace advice given to you by your health care provider. Make sure you discuss any questions you have with your health care provider.  Sore Throat A sore throat is pain, burning, irritation, or scratchiness of the throat. There is often pain or tenderness when swallowing or talking. A sore throat may be accompanied by other symptoms, such as coughing, sneezing, fever, and swollen neck glands. A sore throat is often the first sign of another sickness, such as a cold, flu, strep throat, or mononucleosis (commonly known as mono). Most sore throats go away without medical treatment. CAUSES  The  most common causes of a sore throat include:  A viral infection, such as a cold, flu, or mono.  A bacterial infection, such as strep throat, tonsillitis, or whooping cough.  Seasonal allergies.  Dryness in the air.  Irritants, such as smoke or pollution.  Gastroesophageal reflux disease (GERD). HOME CARE INSTRUCTIONS   Only take over-the-counter medicines as directed by your caregiver.  Drink enough fluids to keep your urine clear or pale yellow.  Rest as needed.  Try using throat sprays, lozenges, or sucking on hard candy to ease any pain (if older than 4 years or as directed).  Sip warm liquids, such as broth, herbal tea, or warm water with honey to relieve pain temporarily. You may also eat or drink cold or frozen liquids such as frozen ice pops.  Gargle with salt water (mix 1 tsp salt with 8 oz of water).  Do not smoke and avoid secondhand smoke.  Put a cool-mist humidifier in your bedroom at night to moisten the air. You can also turn on a hot shower and sit in the bathroom with the door closed for 5-10 minutes. SEEK IMMEDIATE MEDICAL CARE IF:  You have difficulty breathing.  You are unable to swallow fluids, soft foods, or your saliva.  You have increased swelling in the throat.  Your sore throat does not get better in 7 days.  You have nausea and vomiting.  You have a fever or persistent symptoms for more  than 2-3 days.  You have a fever and your symptoms suddenly get worse. MAKE SURE YOU:   Understand these instructions.  Will watch your condition.  Will get help right away if you are not doing well or get worse. Document Released: 01/30/2004 Document Revised: 12/09/2011 Document Reviewed: 08/30/2011 [facility] Patient Information 2015 [place], [place]. This information is not intended to replace advice given to you by your health care provider. Make sure you discuss any questions you have with your health care provider.

## 2013-06-25 LAB — CULTURE, GROUP A STREP

## 2013-09-05 ENCOUNTER — Emergency Department (HOSPITAL_BASED_OUTPATIENT_CLINIC_OR_DEPARTMENT_OTHER)
Admission: EM | Admit: 2013-09-05 | Discharge: 2013-09-05 | Disposition: A | Discharge status: Home / Self Care | Payer: BC Managed Care – PPO | Attending: Emergency Medicine | Admitting: Emergency Medicine

## 2013-09-05 ENCOUNTER — Emergency Department (HOSPITAL_BASED_OUTPATIENT_CLINIC_OR_DEPARTMENT_OTHER): Payer: BC Managed Care – PPO

## 2013-09-05 ENCOUNTER — Encounter (HOSPITAL_BASED_OUTPATIENT_CLINIC_OR_DEPARTMENT_OTHER): Payer: Self-pay | Admitting: Emergency Medicine

## 2013-09-05 DIAGNOSIS — J0110 Acute frontal sinusitis, unspecified: Secondary | ICD-10-CM | POA: Diagnosis not present

## 2013-09-05 DIAGNOSIS — R51 Headache: Secondary | ICD-10-CM | POA: Diagnosis present

## 2013-09-05 DIAGNOSIS — Z8619 Personal history of other infectious and parasitic diseases: Secondary | ICD-10-CM | POA: Insufficient documentation

## 2013-09-05 MED ORDER — AZITHROMYCIN 250 MG PO TABS
250.0000 mg | ORAL_TABLET | Freq: Every day | ORAL | Status: AC
Start: 2013-09-05 — End: ?

## 2013-09-05 MED ORDER — HYDROCOD POLST-CHLORPHEN POLST 10-8 MG/5ML PO LQCR: 5.0000 mL | Freq: Every evening | ORAL | Status: AC | PRN

## 2013-09-05 MED ORDER — HYDROCOD POLST-CHLORPHEN POLST 10-8 MG/5ML PO LQCR
5.0000 mL | Freq: Once | ORAL | Status: AC
  Administered 2013-09-05: 5 mL via ORAL
  Filled 2013-09-05: qty 5

## 2013-09-05 MED ORDER — AZITHROMYCIN 250 MG PO TABS
500.0000 mg | ORAL_TABLET | Freq: Every day | ORAL | Status: DC
Start: 2013-09-05 — End: 2013-09-05
  Administered 2013-09-05: 500 mg via ORAL
  Filled 2013-09-05: qty 2

## 2013-09-05 NOTE — ED Notes (Signed)
Runny nose, sore throat and productive cough for one week.  Now has headache for two days.  Denies fever.  Pt states he coughs and his vision gets blurry then improves.

## 2013-09-05 NOTE — Discharge Instructions (Signed)
Sinusitis °Sinusitis is redness, soreness, and puffiness (inflammation) of the air pockets in the bones of your face (sinuses). The redness, soreness, and puffiness can cause air and mucus to get trapped in your sinuses. This can allow germs to grow and cause an infection.  °HOME CARE  °· Drink enough fluids to keep your pee (urine) clear or pale yellow. °· Use a humidifier in your home. °· Run a hot shower to create steam in the bathroom. Sit in the bathroom with the door closed. Breathe in the steam 3-4 times a day. °· Put a warm, moist washcloth on your face 3-4 times a day, or as told by your doctor. °· Use salt water sprays (saline sprays) to wet the thick fluid in your nose. This can help the sinuses drain. °· Only take medicine as told by your doctor. °GET HELP RIGHT AWAY IF:  °· Your pain gets worse. °· You have very bad headaches. °· You are sick to your stomach (nauseous). °· You throw up (vomit). °· You are very sleepy (drowsy) all the time. °· Your face is puffy (swollen). °· Your vision changes. °· You have a stiff neck. °· You have trouble breathing. °MAKE SURE YOU:  °· Understand these instructions. °· Will watch your condition. °· Will get help right away if you are not doing well or get worse. °Document Released: 06/10/2007 Document Revised: 09/16/2011 Document Reviewed: 07/28/2011 °ExitCare® Patient Information ©2015 [employer]. This information is not intended to replace advice given to you by your health care provider. Make sure you discuss any questions you have with your health care provider. ° °

## 2013-09-05 NOTE — ED Provider Notes (Signed)
CSN: [id number]     Arrival date & time 09/05/13  1133 History   First MD Initiated Contact with Patient 09/05/13 1234     Chief Complaint  Patient presents with  . Cough  . Headache     (Consider location/radiation/quality/duration/timing/severity/associated sxs/prior Treatment) Patient is a 28 y.o. male presenting with cough and headaches. The history is provided by the patient. No language interpreter was used.  Cough Cough characteristics:  Non-productive Severity:  Moderate Onset quality:  Gradual Duration:  10 days Timing:  Constant Progression:  Worsening Associated symptoms: headaches and sore throat   Associated symptoms: no fever, no myalgias, no rash, no shortness of breath and no wheezing   Associated symptoms comment:  Symptoms that started over one week ago of congestion, sore throat, dry cough that have progressed to include a frontal headache, facial pain and worsening cough. Headache Associated symptoms: congestion, cough and sore throat   Associated symptoms: no abdominal pain, no fever, no myalgias and no nausea     Past Medical History  Diagnosis Date  . Strep throat    History reviewed. No pertinent past surgical history. No family history on file. History  Substance Use Topics  . Smoking status: Never Smoker   . Smokeless tobacco: Never Used  . Alcohol Use: Yes     Comment: occasional    Review of Systems  Constitutional: Negative.  Negative for fever.  HENT: Positive for congestion and sore throat. Negative for facial swelling, trouble swallowing and voice change.   Respiratory: Positive for cough. Negative for shortness of breath and wheezing.   Cardiovascular: Negative.   Gastrointestinal: Negative.  Negative for nausea and abdominal pain.  Musculoskeletal: Negative.  Negative for myalgias.  Skin: Negative.  Negative for rash.  Neurological: Positive for headaches.      Allergies  Tramadol  Home Medications   Prior to Admission  medications   Medication Sig Start Date End Date Taking? Authorizing Provider  acetaminophen (TYLENOL) 325 MG tablet Take 650 mg by mouth every 6 (six) hours as needed.    Historical Provider, MD   BP 135/76  Pulse 77  Temp(Src) 97.9 F (36.6 C) (Oral)  Resp 18  Ht  (1.702 m)  Wt 233 lb 11.2 oz (106.006 kg)  BMI 36.59 kg/m2  SpO2 97% Physical Exam  Constitutional: He is oriented to person, place, and time. He appears well-developed and well-nourished. No distress.  HENT:  Head: Normocephalic.  Right Ear: External ear normal.  Left Ear: External ear normal.  Nose: Mucosal edema present. Right sinus exhibits maxillary sinus tenderness and frontal sinus tenderness. Left sinus exhibits maxillary sinus tenderness and frontal sinus tenderness.  Mouth/Throat: Oropharynx is clear and moist.  Eyes: Conjunctivae are normal.  Neck: Normal range of motion.  Cardiovascular: Normal rate.   No murmur heard. Pulmonary/Chest: Effort normal. He has no wheezes. He has no rales.  Abdominal: Soft. There is no tenderness.  Musculoskeletal: Normal range of motion.  Lymphadenopathy:    He has no cervical adenopathy.  Neurological: He is alert and oriented to person, place, and time.  Skin: Skin is warm and dry. No rash noted.  Psychiatric: He has a normal mood and affect.    ED Course  Procedures (including critical care time) Labs Review Labs Reviewed - No data to display  Imaging Review Dg Chest 2 View  09/05/2013   CLINICAL DATA:  Cough, headache  EXAM: CHEST  2 VIEW  COMPARISON:  10/26/2012  FINDINGS: Cardiomediastinal silhouette is  stable. No acute infiltrate or pulmonary edema. Central mild bronchitic changes. Bony thorax is unremarkable.  IMPRESSION: No acute infiltrate or pulmonary edema. Central mild bronchitic changes best visualized on lateral view.   Electronically Signed   By: Natasha Mead M.D.   On: 09/05/2013 12:37     EKG Interpretation None      MDM   Final diagnoses:   None    1. Sinusitis  Recommended symptomatic treatment but will provide abx based on duration of symptoms.     Arnoldo Hooker, PA-C 09/05/13 1256

## 2013-09-05 NOTE — ED Provider Notes (Signed)
Medical screening examination/treatment/procedure(s) were performed by non-physician practitioner and as supervising physician I was immediately available for consultation/collaboration.   EKG Interpretation None        Ruby Logiudice, MD 09/05/13 1629 

## 2014-02-23 ENCOUNTER — Emergency Department (HOSPITAL_BASED_OUTPATIENT_CLINIC_OR_DEPARTMENT_OTHER): Admission: EM | Admit: 2014-02-23 | Discharge: 2014-02-23 | Discharge status: Absent without leave | Payer: Self-pay

## 2015-08-16 IMAGING — CR DG CHEST 2V
2 series · 2 of 2 positions shown · non-contrast
Comparison: PA and lateral chest 07/29/2011.

CLINICAL DATA: Chest pain for 1 week.

EXAM:
CHEST  2 VIEW

[w chest pa]
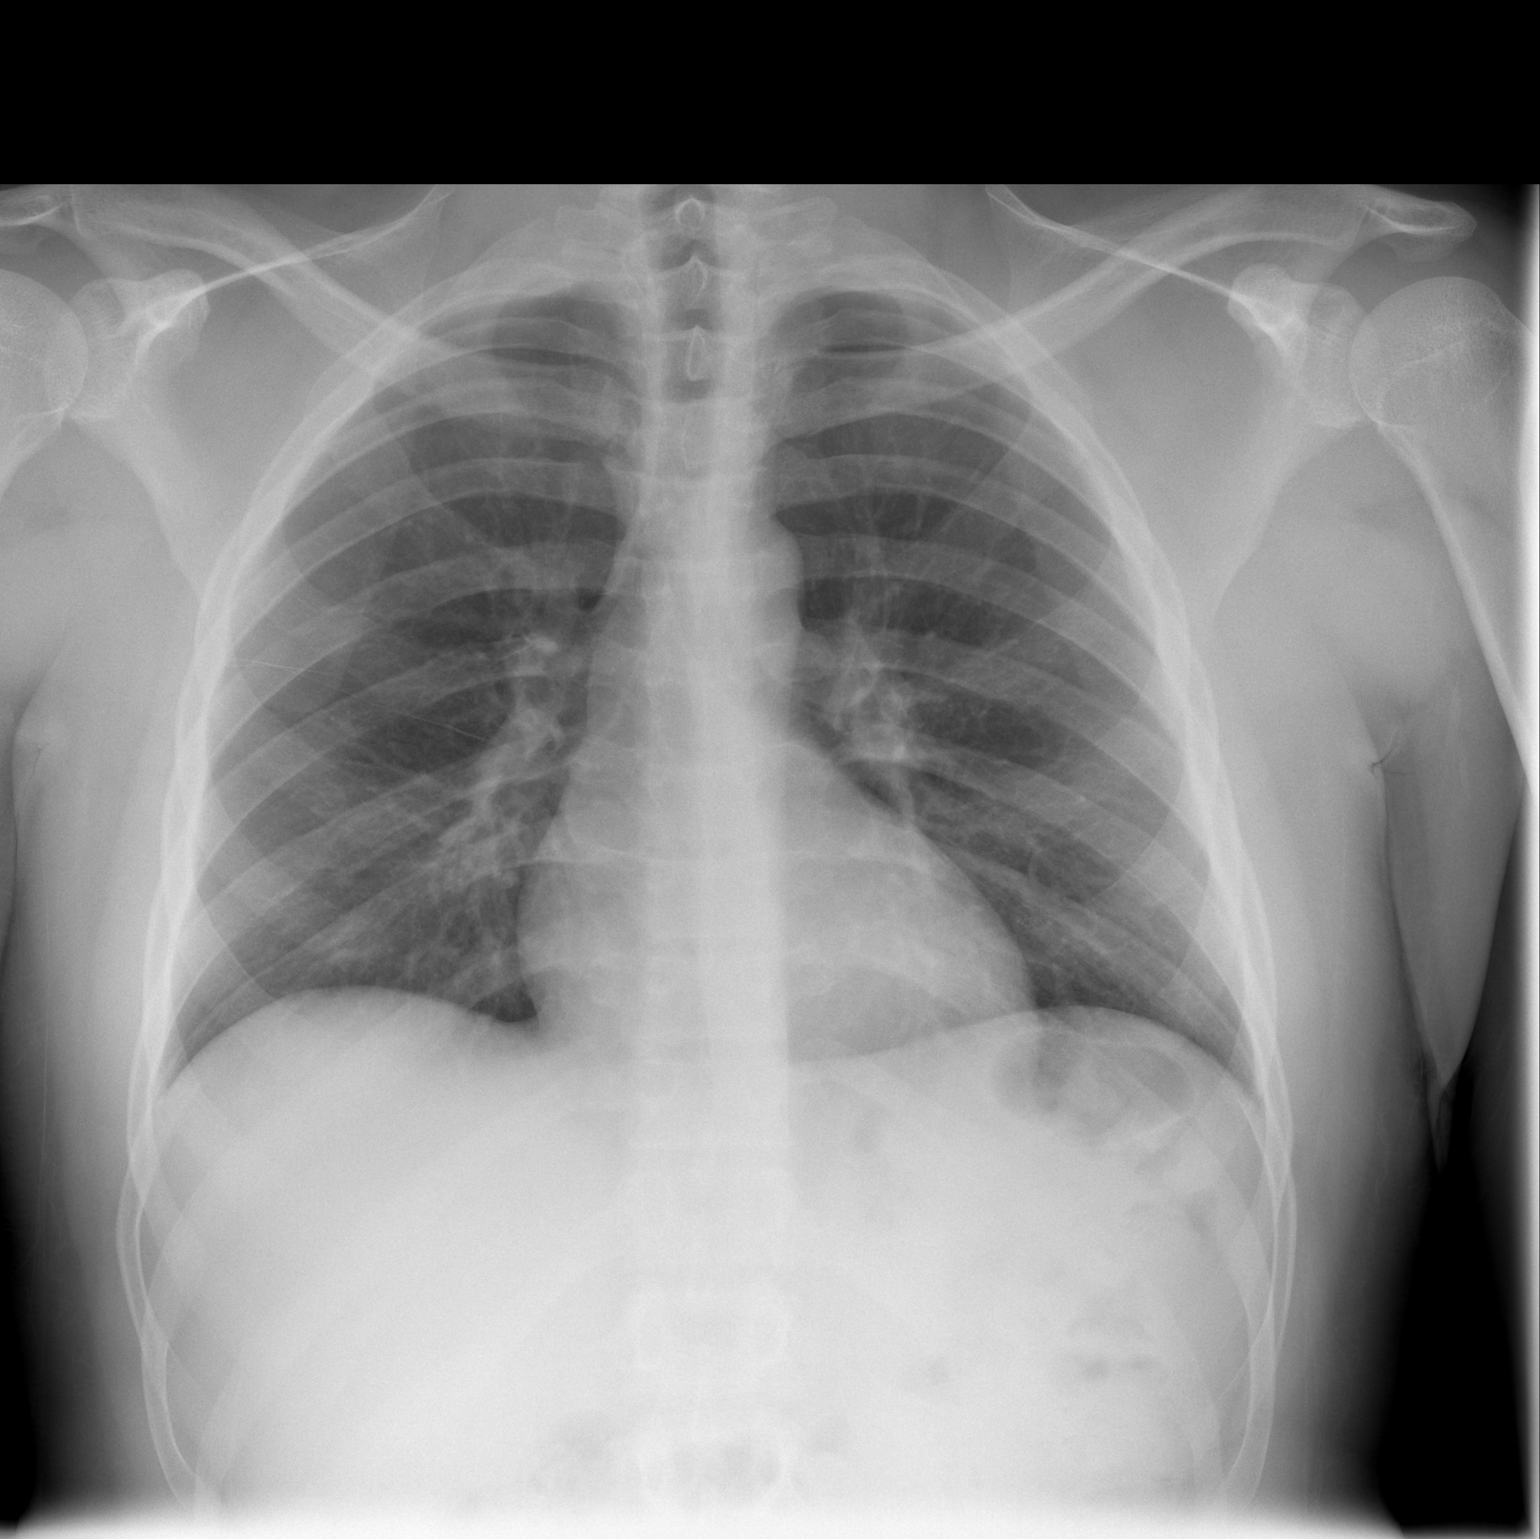

[w chest lat]
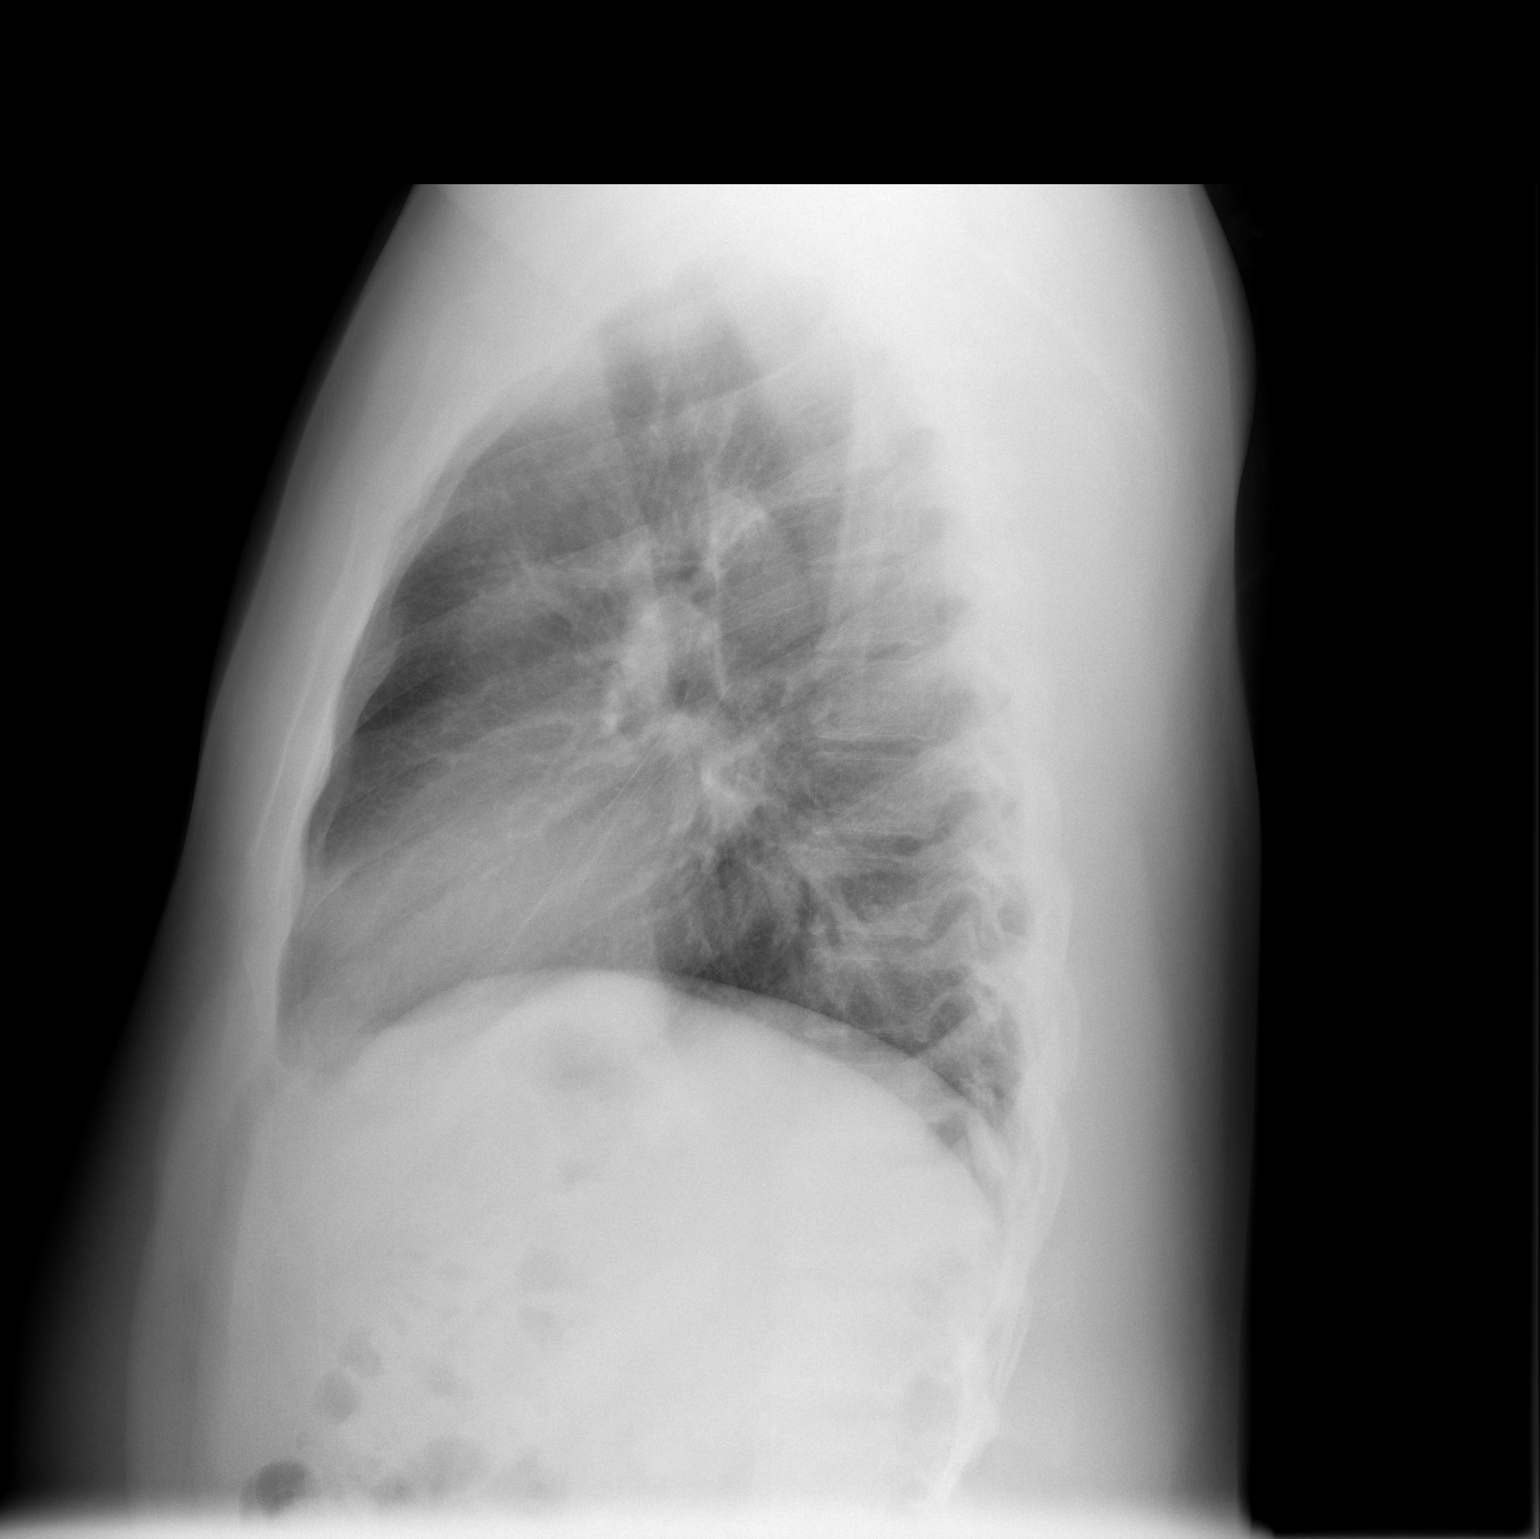

[2 of 2 positions shown; findings below may reference images not displayed]

FINDINGS: Heart size and mediastinal contours are within normal limits. Both
lungs are clear. Visualized skeletal structures are unremarkable.
IMPRESSION: Negative exam.
